# Patient Record
Sex: Male | Born: 2000 | Race: White | Hispanic: No | Marital: Single | Smoking: Never smoker
Health system: Southern US, Community
[De-identification: ages and names within clinical notes are randomized; demographics above are authoritative.]

---
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
## 2018-06-26 DIAGNOSIS — Z23 Encounter for immunization: Secondary | ICD-10-CM | POA: Diagnosis not present

## 2018-07-13 ENCOUNTER — Encounter: Payer: Self-pay | Admitting: Family Medicine

## 2018-07-13 ENCOUNTER — Ambulatory Visit (INDEPENDENT_AMBULATORY_CARE_PROVIDER_SITE_OTHER): Payer: Medicaid Other | Admitting: Family Medicine

## 2018-07-13 VITALS — BP 116/70 | HR 82 | Temp 98.7°F | Wt 146.0 lb

## 2018-07-13 DIAGNOSIS — L03116 Cellulitis of left lower limb: Secondary | ICD-10-CM

## 2018-07-13 MED ORDER — NAPROXEN 500 MG PO TABS: 500.0000 mg | ORAL_TABLET | Freq: Two times a day (BID) | ORAL | 0 refills | Status: DC

## 2018-07-13 MED ORDER — CEPHALEXIN 500 MG PO CAPS: 500.0000 mg | ORAL_CAPSULE | Freq: Four times a day (QID) | ORAL | 0 refills | Status: AC

## 2018-07-13 NOTE — Progress Notes (Signed)
Subjective: CC: Spot on leg PCP: Mechele ClaudeStacks, Warren, MD WJX:BJYNHPI:Purcell Neldon LabellaKallam is a 17 y.o. male presenting to clinic today for:  1. Ingrown hair Patient reports onset of ingrown hair on the left inner thigh about 1 week ago.  He notes on Sunday it started hurting and he attempted to squeeze some fluid out of it.  On Monday the pain increased substantially and he started having pain with his jeans rubbing against it while he walked.  He notes that he applied a heating compress on it last evening and it started draining this morning.  Denies any fevers but does note nausea secondary to significant pain.  He is not used any other medications or topicals on this or taken any medicines for the pain and inflammation.  He did miss work due to lesion today.   ROS: Per HPI  No Known Allergies No past medical history on file.  Current Outpatient Medications:  .  DULoxetine (CYMBALTA) 30 MG capsule, Take 1 capsule (30 mg total) by mouth daily. For one week then two daily. Take with a full stomach at suppertime, Disp: 60 capsule, Rfl: 0 .  loratadine (CLARITIN) 10 MG tablet, Take 10 mg by mouth daily as needed for allergies. OTC, Disp: , Rfl:  .  mometasone (ELOCON) 0.1 % cream, Apply 1 application topically daily., Disp: 45 g, Rfl: 5 Social History   Socioeconomic History  . Marital status: Single    Spouse name: Not on file  . Number of children: Not on file  . Years of education: Not on file  . Highest education level: Not on file  Occupational History  . Not on file  Social Needs  . Financial resource strain: Not on file  . Food insecurity:    Worry: Not on file    Inability: Not on file  . Transportation needs:    Medical: Not on file    Non-medical: Not on file  Tobacco Use  . Smoking status: Never Smoker  . Smokeless tobacco: Never Used  Substance and Sexual Activity  . Alcohol use: No  . Drug use: No  . Sexual activity: Not on file  Lifestyle  . Physical activity:    Days per week:  Not on file    Minutes per session: Not on file  . Stress: Not on file  Relationships  . Social connections:    Talks on phone: Not on file    Gets together: Not on file    Attends religious service: Not on file    Active member of club or organization: Not on file    Attends meetings of clubs or organizations: Not on file    Relationship status: Not on file  . Intimate partner violence:    Fear of current or ex partner: Not on file    Emotionally abused: Not on file    Physically abused: Not on file    Forced sexual activity: Not on file  Other Topics Concern  . Not on file  Social History Narrative  . Not on file   No family history on file.  Objective: Office vital signs reviewed. BP 116/70   Pulse 82   Temp 98.7 F (37.1 C)   Wt 146 lb (66.2 kg)   Physical Examination:  General: Awake, alert, well nourished, No acute distress Skin: Quarter size area of induration and associated erythema with central punctum along the medial aspect of the left thigh.  There is no active drainage.  No active bleeding.  Assessment/ Plan: 17 y.o. male   1. Cellulitis of left lower extremity Likely started out as a folliculitis but now has associated evidence of infection.  He is afebrile demonstrates no evidence of systemic infection.  There is no palpable fluctuance on today's exam and therefore incision and drainage was not attempted.  He does have a central punctum and I have encouraged him to continue warm compresses to the affected area to promote drainage.  Oral antibiotics have been started with Keflex 4 times daily for the next 10 days.  I have also prescribed him oral Naprosyn to use up to twice daily if needed for pain and inflammation.  Reasons for return discussed.  Patient voiced good understanding and will follow-up PRN.   No orders of the defined types were placed in this encounter.  Meds ordered this encounter  Medications  . cephALEXin (KEFLEX) 500 MG capsule    Sig:  Take 1 capsule (500 mg total) by mouth 4 (four) times daily for 10 days.    Dispense:  40 capsule    Refill:  0  . naproxen (NAPROSYN) 500 MG tablet    Sig: Take 1 tablet (500 mg total) by mouth 2 (two) times daily with a meal. (if needed for pain)    Dispense:  30 tablet    Refill:  0      Hulen Skains, DO [facility] [phone]

## 2018-07-13 NOTE — Patient Instructions (Signed)
You have prescribed a nonsteroidal anti-inflammatory drug (NSAID) today. This will help with your pain and inflammation. Please do not take any other NSAIDs (ibuprofen/Motrin/Advil, naproxen/Aleve, meloxicam/Mobic, Voltaren/diclofenac). Please make sure to eat a meal when taking this medication.   Caution:  If you have a history of acid reflux/indigestion, I recommend that you take an antacid (such as Prilosec, Prevacid) daily while on the NSAID.  If you have a history of bleeding disorder, gastric ulcer, are on a blood thinner (like warfarin/Coumadin, Xarelto, Eliquis, etc) please do not take NSAID.  If you have ever had a heart attack, you should not take NSAIDs.   Cellulitis, Adult Cellulitis is a skin infection. The infected area is usually red and tender. This condition occurs most often in the arms and lower legs. The infection can travel to the muscles, blood, and underlying tissue and become serious. It is very important to get treated for this condition. What are the causes? Cellulitis is caused by bacteria. The bacteria enter through a break in the skin, such as a cut, burn, insect bite, open sore, or crack. What increases the risk? This condition is more likely to occur in people who:  Have a weak defense system (immune system).  Have open wounds on the skin such as cuts, burns, bites, and scrapes. Bacteria can enter the body through these open wounds.  Are older.  Have diabetes.  Have a type of long-lasting (chronic) liver disease (cirrhosis) or kidney disease.  Use IV drugs.  What are the signs or symptoms? Symptoms of this condition include:  Redness, streaking, or spotting on the skin.  Swollen area of the skin.  Tenderness or pain when an area of the skin is touched.  Warm skin.  Fever.  Chills.  Blisters.  How is this diagnosed? This condition is diagnosed based on a medical history and physical exam. You may also have tests, including:  Blood  tests.  Lab tests.  Imaging tests.  How is this treated? Treatment for this condition may include:  Medicines, such as antibiotic medicines or antihistamines.  Supportive care, such as rest and application of cold or warm cloths (cold or warm compresses) to the skin.  Hospital care, if the condition is severe.  The infection usually gets better within 1-2 days of treatment. Follow these instructions at home:  Take over-the-counter and prescription medicines only as told by your health care provider.  If you were prescribed an antibiotic medicine, take it as told by your health care provider. Do not stop taking the antibiotic even if you start to feel better.  Drink enough fluid to keep your urine clear or pale yellow.  Do not touch or rub the infected area.  Raise (elevate) the infected area above the level of your heart while you are sitting or lying down.  Apply warm or cold compresses to the affected area as told by your health care provider.  Keep all follow-up visits as told by your health care provider. This is important. These visits let your health care provider make sure a more serious infection is not developing. Contact a health care provider if:  You have a fever.  Your symptoms do not improve within 1-2 days of starting treatment.  Your bone or joint underneath the infected area becomes painful after the skin has healed.  Your infection returns in the same area or another area.  You notice a swollen bump in the infected area.  You develop new symptoms.  You have a  general ill feeling (malaise) with muscle aches and pains. Get help right away if:  Your symptoms get worse.  You feel very sleepy.  You develop vomiting or diarrhea that persists.  You notice red streaks coming from the infected area.  Your red area gets larger or turns dark in color. This information is not intended to replace advice given to you by your health care provider. Make sure  you discuss any questions you have with your health care provider. Document Released: 05/27/2005 Document Revised: 12/26/2015 Document Reviewed: 06/26/2015 Elsevier Interactive Patient Education  [employer]2018 Elsevier Inc.

## 2018-08-15 ENCOUNTER — Ambulatory Visit (INDEPENDENT_AMBULATORY_CARE_PROVIDER_SITE_OTHER): Payer: Medicaid Other | Admitting: Family Medicine

## 2018-08-15 ENCOUNTER — Encounter: Payer: Self-pay | Admitting: Family Medicine

## 2018-08-15 VITALS — BP 127/86 | HR 81 | Temp 98.5°F | Ht 66.0 in | Wt 138.0 lb

## 2018-08-15 DIAGNOSIS — Z113 Encounter for screening for infections with a predominantly sexual mode of transmission: Secondary | ICD-10-CM | POA: Diagnosis not present

## 2018-08-15 DIAGNOSIS — B3789 Other sites of candidiasis: Secondary | ICD-10-CM

## 2018-08-15 DIAGNOSIS — R21 Rash and other nonspecific skin eruption: Secondary | ICD-10-CM | POA: Diagnosis not present

## 2018-08-15 DIAGNOSIS — B356 Tinea cruris: Secondary | ICD-10-CM | POA: Diagnosis not present

## 2018-08-15 MED ORDER — FLUCONAZOLE 150 MG PO TABS: 150.0000 mg | ORAL_TABLET | Freq: Once | ORAL | 0 refills | Status: AC

## 2018-08-15 MED ORDER — CLOTRIMAZOLE-BETAMETHASONE 1-0.05 % EX CREA: 1.0000 "application " | TOPICAL_CREAM | Freq: Two times a day (BID) | CUTANEOUS | 0 refills | Status: DC

## 2018-08-15 NOTE — Progress Notes (Signed)
Subjective:    Patient ID: Kirk Holloway, male    DOB: [birth year], 17 y.o.   MRN: [id number]  Chief Complaint:  Testicular rash   HPI: Kirk Holloway is a 17 y.o. male presenting on 08/15/2018 for Testicular rash  Pt presents today with an ongoing testicular rash. States this started several months ago. States the rash is pruritic and erythematous. States at times the rash drains and is foul smelling. States he does shave his genitals, but this does not seem to increase symptoms. Pt states he is sexually active. Denies penile discharge or open lesions on his penis. Denies fever, chills, thrush, polydipsia, polyphagia, polyuria, dysuria, or weight changes.   Relevant past medical, surgical, family, and social history reviewed and updated as indicated.  Allergies and medications reviewed and updated.   History reviewed. No pertinent past medical history.  History reviewed. No pertinent surgical history.  Social History   Socioeconomic History  . Marital status: Single    Spouse name: Not on file  . Number of children: Not on file  . Years of education: Not on file  . Highest education level: Not on file  Occupational History  . Not on file  Social Needs  . Financial resource strain: Not on file  . Food insecurity:    Worry: Not on file    Inability: Not on file  . Transportation needs:    Medical: Not on file    Non-medical: Not on file  Tobacco Use  . Smoking status: Never Smoker  . Smokeless tobacco: Never Used  Substance and Sexual Activity  . Alcohol use: No  . Drug use: No  . Sexual activity: Not on file  Lifestyle  . Physical activity:    Days per week: Not on file    Minutes per session: Not on file  . Stress: Not on file  Relationships  . Social connections:    Talks on phone: Not on file    Gets together: Not on file    Attends religious service: Not on file    Active member of club or organization: Not on file    Attends meetings of clubs or  organizations: Not on file    Relationship status: Not on file  . Intimate partner violence:    Fear of current or ex partner: Not on file    Emotionally abused: Not on file    Physically abused: Not on file    Forced sexual activity: Not on file  Other Topics Concern  . Not on file  Social History Narrative  . Not on file    Outpatient Encounter Medications as of 08/15/2018  Medication Sig  . clotrimazole-betamethasone (LOTRISONE) cream Apply 1 application topically 2 (two) times Holloway.  . fluconazole (DIFLUCAN) 150 MG tablet Take 1 tablet (150 mg total) by mouth once for 1 dose.  . mometasone (ELOCON) 0.1 % cream Apply 1 application topically Holloway. (Patient not taking: Reported on 07/13/2018)  . [DISCONTINUED] DULoxetine (CYMBALTA) 30 MG capsule Take 1 capsule (30 mg total) by mouth Holloway. For one week then two Holloway. Take with a full stomach at suppertime (Patient not taking: Reported on 07/13/2018)  . [DISCONTINUED] loratadine (CLARITIN) 10 MG tablet Take 10 mg by mouth Holloway as needed for allergies. OTC  . [DISCONTINUED] naproxen (NAPROSYN) 500 MG tablet Take 1 tablet (500 mg total) by mouth 2 (two) times Holloway with a meal. (if needed for pain)   No facility-administered encounter medications on file as of  08/15/2018.     No Known Allergies  Review of Systems  Constitutional: Negative for chills, fatigue, fever and unexpected weight change.  Respiratory: Negative for shortness of breath.   Cardiovascular: Negative for chest pain.  Gastrointestinal: Negative for abdominal pain and rectal pain.  Endocrine: Negative for polydipsia, polyphagia and polyuria.  Genitourinary: Positive for scrotal swelling. Negative for decreased urine volume, discharge, dysuria, enuresis, flank pain, frequency, genital sores, hematuria, penile pain, penile swelling, testicular pain and urgency.       Testicular rash  Musculoskeletal: Negative for arthralgias and myalgias.  Neurological: Negative  for weakness.  Psychiatric/Behavioral: Negative for confusion.  All other systems reviewed and are negative.       Objective:    BP 127/86   Pulse 81   Temp 98.5 F (36.9 C) (Oral)   Ht '5\' 6"'  (1.676 m)   Wt 138 lb (62.6 kg)   BMI 22.27 kg/m    Wt Readings from Last 3 Encounters:  08/15/18 138 lb (62.6 kg) (25 %, Z= -0.68)*  07/13/18 146 lb (66.2 kg) (39 %, Z= -0.28)*  12/30/16 137 lb (62.1 kg) (35 %, Z= -0.39)*   * Growth percentiles are based on CDC (Boys, 2-20 Years) data.    Physical Exam Vitals signs and nursing note reviewed.  Constitutional:      General: He is not in acute distress.    Appearance: Normal appearance. He is not ill-appearing.  HENT:     Head: Normocephalic and atraumatic.     Mouth/Throat:     Mouth: Mucous membranes are moist.     Pharynx: Oropharynx is clear.  Eyes:     Conjunctiva/sclera: Conjunctivae normal.     Pupils: Pupils are equal, round, and reactive to light.  Cardiovascular:     Rate and Rhythm: Normal rate and regular rhythm.     Heart sounds: Normal heart sounds.  Pulmonary:     Effort: Pulmonary effort is normal.     Breath sounds: Normal breath sounds.  Abdominal:     General: Abdomen is flat.     Palpations: Abdomen is soft.     Tenderness: There is no abdominal tenderness.  Genitourinary:    Pubic Area: Rash present. No pubic lice.      Penis: Normal and circumcised. No erythema, tenderness, discharge or lesions.      Scrotum/Testes: Cremasteric reflex is present.        Right: Tenderness (due to rash) present. Mass or swelling not present.        Left: Tenderness (due to rash) present. Mass or swelling not present.     Epididymis:     Right: Normal.     Left: Normal.     Comments: Scrotum: erythematous patches with satellite papules.  Skin:    General: Skin is warm and dry.     Capillary Refill: Capillary refill takes less than 2 seconds.  Neurological:     Mental Status: He is alert and oriented to person,  place, and time.  Psychiatric:        Attention and Perception: Attention and perception normal.        Mood and Affect: Mood normal.        Speech: Speech normal.        Behavior: Behavior normal. Behavior is cooperative.        Thought Content: Thought content normal.        Cognition and Memory: Cognition and memory normal.        Judgment:  Judgment normal.     Results for orders placed or performed in visit on 12/30/16  Rapid strep screen (not at [facility])  Result Value Ref Range   Strep Gp A Ag, IA W/Reflex Positive (A) Negative       Pertinent labs & imaging results that were available during my care of the patient were reviewed by me and considered in my medical decision making.  Assessment & Plan:  Diago was seen today for testicular rash.  Diagnoses and all orders for this visit:  Scrotal rash -     Chlamydia/Gonococcus/Trichomonas, NAA -     HIV Antibody (routine testing w rflx) -     BMP8+EGFR -     fluconazole (DIFLUCAN) 150 MG tablet; Take 1 tablet (150 mg total) by mouth once for 1 dose. -     clotrimazole-betamethasone (LOTRISONE) cream; Apply 1 application topically 2 (two) times Holloway.  Candida rash of groin Due to ongoing rash, will screen for diabetes and HIV today. Keep area as dry as possible. Wear loose fitting cotton underwear. Shower instead of baths. Proper hygiene discussed. Medications as prescribed. Report any new or worsening symptoms.  -     BMP8+EGFR -     fluconazole (DIFLUCAN) 150 MG tablet; Take 1 tablet (150 mg total) by mouth once for 1 dose. -     clotrimazole-betamethasone (LOTRISONE) cream; Apply 1 application topically 2 (two) times Holloway.  Routine screening for STI (sexually transmitted infection) -     Chlamydia/Gonococcus/Trichomonas, NAA -     HIV Antibody (routine testing w rflx)    Continue all other maintenance medications.  Follow up plan: Return in about 4 weeks (around 09/12/2018), or if symptoms worsen or fail to  improve.  Educational handout given for jock itch  The above assessment and management plan was discussed with the patient. The patient verbalized understanding of and has agreed to the management plan. Patient is aware to call the clinic if symptoms persist or worsen. Patient is aware when to return to the clinic for a follow-up visit. Patient educated on when it is appropriate to go to the emergency department.   Monia Pouch, FNP-C [facility] [phone]

## 2018-08-15 NOTE — Patient Instructions (Signed)
Jock Itch Jock itch (tinea cruris) is a fungal infection of the skin in the groin area. It is sometimes called ringworm, even though it is not caused by worms. It is caused by a fungus, which is a type of germ that thrives in dark, damp places. Jock itch causes a rash and itching in the groin and upper thigh area. It usually goes away in 2-3 weeks with treatment. What are the causes? The fungus that causes jock itch may be spread by:  Touching a fungus infection elsewhere on your body-such as athlete's foot-and then touching your groin area.  Sharing towels or clothing with an infected person.  What increases the risk? Jock itch is most common in men and adolescent boys. This condition is more likely to develop from:  Being in hot, humid climates.  Wearing tight-fitting clothing or wet bathing suits for long periods of time.  Participating in sports.  Being overweight.  Having diabetes.  What are the signs or symptoms? Symptoms of jock itch may include:  A red, pink, or brown rash in the groin area. The rash may spread to the thighs, anus, and buttocks.  Dry and scaly skin on or around the rash.  Itchiness.  How is this diagnosed? Most often, a health care provider can make the diagnosis by looking at your rash. Sometimes, a scraping of the infected skin will be taken. This sample may be tested by looking at it under a microscope or by trying to grow the fungus from the sample (culture). How is this treated? Treatment for this condition may include:  Antifungal medicine to kill the fungus. This may be in various forms: ? Skin cream or ointment. ? Medicine taken by mouth.  Skin cream or ointment to reduce the itching.  Compresses or medicated powders to dry the infected skin.  Follow these instructions at home:  Take medicines only as directed by your health care provider. Apply skin creams or ointments exactly as directed.  Wear loose-fitting clothing. ? Men should  wear cotton boxer shorts. ? Women should wear cotton underwear.  Change your underwear every day to keep your groin dry.  Avoid hot baths.  Dry your groin area well after bathing. ? Use a separate towel to dry your groin area. This will help to prevent a spreading of the infection to other areas of your body.  Do not scratch the affected area.  Do not share towels with other people. Contact a health care provider if:  Your rash does not improve or it gets worse after 2 weeks of treatment.  Your rash is spreading.  Your rash returns after treatment is finished.  You have a fever.  You have redness, swelling, or pain in the area around your rash.  You have fluid, blood, or pus coming from your rash.  Your have your rash for more than 4 weeks. This information is not intended to replace advice given to you by your health care provider. Make sure you discuss any questions you have with your health care provider. Document Released: 08/07/2002 Document Revised: 01/23/2016 Document Reviewed: 05/29/2014 Elsevier Interactive Patient Education  2018 Elsevier Inc.  

## 2018-08-16 LAB — HIV ANTIBODY (ROUTINE TESTING W REFLEX): HIV SCREEN 4TH GENERATION: NONREACTIVE

## 2018-08-16 LAB — BMP8+EGFR
BUN / CREAT RATIO: 14 (ref 9–20)
BUN: 14 mg/dL (ref 6–20)
CHLORIDE: 102 mmol/L (ref 96–106)
CO2: 22 mmol/L (ref 20–29)
Calcium: 9.7 mg/dL (ref 8.7–10.2)
Creatinine, Ser: 1.03 mg/dL (ref 0.76–1.27)
GFR calc non Af Amer: 105 mL/min/{1.73_m2} (ref >59)
GFR, EST AFRICAN AMERICAN: 121 mL/min/{1.73_m2} (ref >59)
Glucose: 85 mg/dL (ref 65–99)
POTASSIUM: 3.9 mmol/L (ref 3.5–5.2)
SODIUM: 142 mmol/L (ref 134–144)

## 2018-08-17 LAB — CHLAMYDIA/GONOCOCCUS/TRICHOMONAS, NAA
CHLAMYDIA BY NAA: NEGATIVE
GONOCOCCUS BY NAA: NEGATIVE
TRICH VAG BY NAA: NEGATIVE

## 2018-08-29 ENCOUNTER — Encounter: Payer: Self-pay | Admitting: Family Medicine

## 2018-08-29 ENCOUNTER — Ambulatory Visit (INDEPENDENT_AMBULATORY_CARE_PROVIDER_SITE_OTHER): Payer: Medicaid Other | Admitting: Family Medicine

## 2018-08-29 VITALS — BP 137/81 | HR 101 | Temp 98.0°F | Ht 66.0 in | Wt 144.8 lb

## 2018-08-29 DIAGNOSIS — L738 Other specified follicular disorders: Secondary | ICD-10-CM | POA: Diagnosis not present

## 2018-08-29 MED ORDER — MINOCYCLINE HCL 100 MG PO CAPS: 100.0000 mg | ORAL_CAPSULE | Freq: Two times a day (BID) | ORAL | 0 refills | Status: DC

## 2018-08-29 MED ORDER — FLUCONAZOLE 100 MG PO TABS: ORAL_TABLET | ORAL | 0 refills | Status: DC

## 2018-08-29 MED ORDER — ONDANSETRON 8 MG PO TBDP: 8.0000 mg | ORAL_TABLET | Freq: Four times a day (QID) | ORAL | 1 refills | Status: DC | PRN

## 2018-08-29 NOTE — Progress Notes (Signed)
Chief Complaint  Patient presents with  . itching rash on testicles    HPI  Patient presents today for itching rash at the testicles partially treated last week with Lotrisone and Diflucan.  He is now having some red papules in the upper inner thighs pubic area and one on the hip.  1 of the areas opened up and drained scant amount of mattering earlier today.  PMH: Smoking status noted ROS: Per HPI  Objective: BP 137/81   Pulse (!) 101   Temp 98 F (36.7 C)   Ht 5\' 6"  (1.676 m)   Wt 144 lb 12.8 oz (65.7 kg)   BMI 23.37 kg/m  Gen: NAD, alert, cooperative with exam HEENT: NCAT, EOMI, PERRL CV: RRR, good S1/S2, no murmur Resp: CTABL, no wheezes, non-labored Skin: There are scant erythematous papules.  These are scattered on the upper inner thighs.  There is one at the right inner thigh that is swollen and red measuring 1 cm it opened and has some pus draining from it. Ext: No edema, warm Neuro: Alert and oriented, No gross deficits  Assessment and plan:  1. Bacterial folliculitis     Meds ordered this encounter  Medications  . minocycline (MINOCIN) 100 MG capsule    Sig: Take 1 capsule (100 mg total) by mouth 2 (two) times daily. Take on an empty stomach    Dispense:  20 capsule    Refill:  0  . fluconazole (DIFLUCAN) 100 MG tablet    Sig: Take two with first dose. Then starting the next day take one daily until all are taken.    Dispense:  11 tablet    Refill:  0  . ondansetron (ZOFRAN-ODT) 8 MG disintegrating tablet    Sig: Take 1 tablet (8 mg total) by mouth every 6 (six) hours as needed for nausea or vomiting.    Dispense:  20 tablet    Refill:  1      Follow up as needed.  Mechele ClaudeWarren Astaria Nanez, MD

## 2018-11-07 DIAGNOSIS — L309 Dermatitis, unspecified: Secondary | ICD-10-CM | POA: Diagnosis not present

## 2018-11-07 DIAGNOSIS — L739 Follicular disorder, unspecified: Secondary | ICD-10-CM | POA: Diagnosis not present

## 2019-01-04 DIAGNOSIS — L089 Local infection of the skin and subcutaneous tissue, unspecified: Secondary | ICD-10-CM | POA: Diagnosis not present

## 2019-01-04 DIAGNOSIS — L080 Pyoderma: Secondary | ICD-10-CM | POA: Diagnosis not present

## 2019-07-14 ENCOUNTER — Other Ambulatory Visit: Payer: Self-pay

## 2019-07-14 DIAGNOSIS — Z20822 Contact with and (suspected) exposure to covid-19: Secondary | ICD-10-CM

## 2019-07-16 LAB — NOVEL CORONAVIRUS, NAA: SARS-CoV-2, NAA: NOT DETECTED

## 2019-08-14 ENCOUNTER — Other Ambulatory Visit: Payer: Self-pay

## 2019-08-15 ENCOUNTER — Encounter: Payer: Self-pay | Admitting: Family Medicine

## 2019-08-15 ENCOUNTER — Ambulatory Visit (INDEPENDENT_AMBULATORY_CARE_PROVIDER_SITE_OTHER): Payer: BC Managed Care – PPO | Admitting: Family Medicine

## 2019-08-15 VITALS — BP 117/70 | HR 76 | Temp 97.8°F | Resp 20 | Ht 66.0 in | Wt 137.0 lb

## 2019-08-15 DIAGNOSIS — E010 Iodine-deficiency related diffuse (endemic) goiter: Secondary | ICD-10-CM

## 2019-08-15 NOTE — Progress Notes (Signed)
Subjective:  Patient ID: Kirk Holloway, male    DOB: 02/26/2001  Age: 18 y.o. MRN: [id number]  CC: Medical Management of Chronic Issues (thyroid check )   HPI Euel Castile presents for concern for thyromegaly. HE wasnts to enter the [employer], but was told he had an enlarged thyroid a few years ago. This was never confirmed. Pt. Denies sx of hhyperand hypothyroidism except that he has lost weight, 12 lbs. But attributes that to diet.   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   He has no past surgical history on file.   His family history is not on file.He reports that he has never smoked. He has never used smokeless tobacco. He reports that he does not drink alcohol or use drugs.    ROS Review of Systems  Constitutional: Positive for appetite change. Negative for activity change and fever.  HENT: Negative.   Respiratory: Negative for cough, chest tightness and shortness of breath.   Cardiovascular: Negative for chest pain and palpitations.  Gastrointestinal: Negative for abdominal pain.  Musculoskeletal: Negative for arthralgias.    Objective:  BP 117/70   Pulse 76   Temp 97.8 F (36.6 C)   Resp 20   Ht 5\' 6"  (1.676 m)   Wt 137 lb (62.1 kg)   SpO2 99%   BMI 22.11 kg/m   BP Readings from Last 3 Encounters:  08/15/19 117/70  08/29/18 137/81  08/15/18 127/86    Wt Readings from Last 3 Encounters:  08/15/19 137 lb (62.1 kg)  08/29/18 144 lb 12.8 oz (65.7 kg) (36 %, Z= -0.36)*  08/15/18 138 lb (62.6 kg) (25 %, Z= -0.68)*   * Growth percentiles  are based on CDC (Boys, 2-20 Years) data.     Physical Exam Vitals reviewed.  Constitutional:      Appearance: He is well-developed.  HENT:     Head: Normocephalic and atraumatic.     Right Ear: Tympanic membrane and external ear normal. No decreased hearing noted.     Left Ear: Tympanic membrane and external ear normal. No decreased hearing noted.     Mouth/Throat:     Pharynx: No oropharyngeal exudate or posterior oropharyngeal erythema.  Eyes:     Pupils: Pupils are equal, round, and reactive to light.  Cardiovascular:     Rate and Rhythm: Normal rate and regular rhythm.     Heart sounds: No murmur.  Pulmonary:     Effort: No respiratory distress.     Breath sounds: Normal breath sounds.  Abdominal:     General: Bowel sounds are normal.     Palpations: Abdomen is soft. There is no mass.     Tenderness: There is no abdominal tenderness.  Musculoskeletal:     Cervical back: Normal range of motion and neck supple.       Assessment & Plan:   Bennett was seen today for medical management of chronic issues.  Diagnoses and all orders for  this visit:  Thyromegaly -     CBC -     CMP -     TSH + free T4 -     US Thyroid & ST Neck; Future       I have discontinued [facility]'s clotrimazole-betamethasone, minocycline, fluconazole, and ondansetron.  Allergies as of 08/15/2019   No Known Allergies     Medication List       Accurate as of August 15, 2019  3:45 PM. If you have any questions, ask your nurse or doctor.        STOP taking these medications   clotrimazole-betamethasone cream Commonly known as: Lotrisone Stopped by: Mechele Claude, MD   fluconazole 100 MG tablet Commonly known as: Diflucan Stopped by: Mechele Claude, MD   minocycline 100 MG capsule Commonly known as: Minocin Stopped by: Mechele Claude, MD   ondansetron 8 MG disintegrating tablet Commonly known as: ZOFRAN-ODT Stopped by: Mechele Claude, MD        Follow-up: Return if  symptoms worsen or fail to improve.  Mechele Claude, M.D.

## 2019-08-21 ENCOUNTER — Encounter: Payer: Self-pay | Admitting: *Deleted

## 2019-08-23 ENCOUNTER — Other Ambulatory Visit: Payer: Self-pay | Admitting: Family Medicine

## 2019-08-23 ENCOUNTER — Other Ambulatory Visit: Payer: Self-pay

## 2019-08-23 ENCOUNTER — Ambulatory Visit (HOSPITAL_COMMUNITY)
Admission: RE | Admit: 2019-08-23 | Discharge: 2019-08-23 | Disposition: A | Discharge status: Home / Self Care | Payer: BC Managed Care – PPO | Source: Ambulatory Visit | Attending: Family Medicine | Admitting: Family Medicine

## 2019-08-23 DIAGNOSIS — E010 Iodine-deficiency related diffuse (endemic) goiter: Secondary | ICD-10-CM | POA: Insufficient documentation

## 2020-06-25 DIAGNOSIS — S0591XA Unspecified injury of right eye and orbit, initial encounter: Secondary | ICD-10-CM | POA: Diagnosis not present

## 2020-06-26 DIAGNOSIS — S0511XA Contusion of eyeball and orbital tissues, right eye, initial encounter: Secondary | ICD-10-CM | POA: Diagnosis not present

## 2020-06-26 DIAGNOSIS — H0288A Meibomian gland dysfunction right eye, upper and lower eyelids: Secondary | ICD-10-CM | POA: Diagnosis not present

## 2020-06-26 DIAGNOSIS — H1131 Conjunctival hemorrhage, right eye: Secondary | ICD-10-CM | POA: Diagnosis not present

## 2020-06-26 DIAGNOSIS — H0288B Meibomian gland dysfunction left eye, upper and lower eyelids: Secondary | ICD-10-CM | POA: Diagnosis not present

## 2020-10-13 DIAGNOSIS — H5213 Myopia, bilateral: Secondary | ICD-10-CM | POA: Diagnosis not present

## 2021-01-06 ENCOUNTER — Encounter: Payer: Self-pay | Admitting: Nurse Practitioner

## 2021-01-06 ENCOUNTER — Ambulatory Visit (INDEPENDENT_AMBULATORY_CARE_PROVIDER_SITE_OTHER): Payer: Medicaid Other | Admitting: Nurse Practitioner

## 2021-01-06 DIAGNOSIS — J029 Acute pharyngitis, unspecified: Secondary | ICD-10-CM | POA: Diagnosis not present

## 2021-01-06 LAB — RAPID STREP SCREEN (MED CTR MEBANE ONLY): Strep Gp A Ag, IA W/Reflex: NEGATIVE

## 2021-01-06 LAB — CULTURE, GROUP A STREP

## 2021-01-06 MED ORDER — FLUTICASONE PROPIONATE 50 MCG/ACT NA SUSP: 2.0000 | Freq: Every day | NASAL | 6 refills | Status: DC

## 2021-01-06 MED ORDER — AMOXICILLIN-POT CLAVULANATE 875-125 MG PO TABS: 1.0000 | ORAL_TABLET | Freq: Two times a day (BID) | ORAL | 0 refills | Status: DC

## 2021-01-06 NOTE — Progress Notes (Signed)
   Virtual Visit  Note Due to COVID-19 pandemic this visit was conducted virtually. This visit type was conducted due to national recommendations for restrictions regarding the COVID-19 Pandemic (e.g. social distancing, sheltering in place) in an effort to limit this patient's exposure and mitigate transmission in our community. All issues noted in this document were discussed and addressed.  A physical exam was not performed with this format.  I connected with Kirk Holloway on 01/06/21 at 11:19 PM by telephone and verified that I am speaking with the correct person using two identifiers. Kirk Holloway is currently located at home during visit. The provider, Daryll Drown, NP is located in their office at time of visit.  I discussed the limitations, risks, security and privacy concerns of performing an evaluation and management service by telephone and the availability of in person appointments. I also discussed with the patient that there may be a patient responsible charge related to this service. The patient expressed understanding and agreed to proceed.   History and Present Illness:  Sore Throat  This is a new problem. The problem has been gradually worsening. Neither side of throat is experiencing more pain than the other. There has been no fever. The pain is moderate. Associated symptoms include congestion, ear pain and swollen glands. Associated symptoms comments: Jaw pain. He has had no exposure to strep or mono.      Review of Systems  Constitutional: Negative for chills, fever and malaise/fatigue.  HENT: Positive for congestion, ear pain and sore throat.   Skin: Negative for rash.  All other systems reviewed and are negative.    Observations/Objective:  Televisit-patient did not sound to be in distress.  Assessment and Plan:  Sore throat In terms not well controlled. Take medication as prescribed Augmentin 875-125 mg tablet twice daily. Flonase COVID-19 swab/strep throat  completed results pending. Education provided to patient  Rx sent to pharmacy Follow-up with worsening or unresolved symptoms.   Follow Up Instructions: Follow-up with worsening or unresolved symptoms.    I discussed the assessment and treatment plan with the patient. The patient was provided an opportunity to ask questions and all were answered. The patient agreed with the plan and demonstrated an understanding of the instructions.   The patient was advised to call back or seek an in-person evaluation if the symptoms worsen or if the condition fails to improve as anticipated.  The above assessment and management plan was discussed with the patient. The patient verbalized understanding of and has agreed to the management plan. Patient is aware to call the clinic if symptoms persist or worsen. Patient is aware when to return to the clinic for a follow-up visit. Patient educated on when it is appropriate to go to the emergency department.   Time call ended: 11:25 PM  I provided 6 minutes of  non face-to-face time during this encounter.    Daryll Drown, NP

## 2021-01-06 NOTE — Patient Instructions (Signed)

## 2021-01-06 NOTE — Assessment & Plan Note (Signed)
In terms not well controlled. Take medication as prescribed Augmentin 875-125 mg tablet twice daily. Flonase COVID-19 swab/strep throat completed results pending. Education provided to patient  Rx sent to pharmacy Follow-up with worsening or unresolved symptoms.

## 2021-01-07 LAB — NOVEL CORONAVIRUS, NAA: SARS-CoV-2, NAA: NOT DETECTED

## 2021-01-07 LAB — SARS-COV-2, NAA 2 DAY TAT

## 2021-01-28 ENCOUNTER — Telehealth: Payer: Self-pay

## 2021-01-28 NOTE — Telephone Encounter (Signed)
I contacted the patients father and he already has his son scheduled to see a different doctor tomorrow and he said thank you for accepting him but he will call back later to schedule.

## 2021-01-28 NOTE — Telephone Encounter (Signed)
Patient father is calling and stated that provider is his pcp and wanted to know if provider would accept son as new patient, please advise. CB is [phone]

## 2021-01-28 NOTE — Telephone Encounter (Signed)
I contacted the patients father and he will call back later to schedule

## 2021-01-29 ENCOUNTER — Other Ambulatory Visit: Payer: Self-pay

## 2021-01-29 ENCOUNTER — Encounter: Payer: Self-pay | Admitting: Nurse Practitioner

## 2021-01-29 ENCOUNTER — Ambulatory Visit (INDEPENDENT_AMBULATORY_CARE_PROVIDER_SITE_OTHER): Payer: Medicaid Other | Admitting: Nurse Practitioner

## 2021-01-29 VITALS — BP 122/77 | HR 110 | Temp 97.9°F | Ht 66.0 in | Wt 152.0 lb

## 2021-01-29 DIAGNOSIS — H6121 Impacted cerumen, right ear: Secondary | ICD-10-CM | POA: Diagnosis not present

## 2021-01-29 MED ORDER — DEBROX 6.5 % OT SOLN: 5.0000 [drp] | Freq: Two times a day (BID) | OTIC | 0 refills | Status: DC

## 2021-01-29 NOTE — Patient Instructions (Signed)

## 2021-01-29 NOTE — Assessment & Plan Note (Signed)
Ceruminosis is noted.  Wax is removed by syringing and manual debridement. Instructions for home care to prevent wax buildup are given, patient verbalized understanding.

## 2021-01-29 NOTE — Progress Notes (Signed)
Acute Office Visit  Subjective:    Patient ID: Kirk Holloway, male    DOB: 12/21/2000, 20 y.o.   MRN: [id number]  Chief Complaint  Patient presents with  . Hearing Problem    HPI Patient is a 20 year old male who presents to clinic with decrease hearing and cerumen impaction in the last few weeks.  No fever, pain, dizziness, or ringing.  Patient reports in the past has had a history of cerumen impaction.   Past Medical History:  Diagnosis Date  . Familial short stature 04/30/2016    History reviewed. No pertinent surgical history.  History reviewed. No pertinent family history.  Social History   Socioeconomic History  . Marital status: Single    Spouse name: Not on file  . Number of children: Not on file  . Years of education: Not on file  . Highest education level: Not on file  Occupational History  . Not on file  Tobacco Use  . Smoking status: Never Smoker  . Smokeless tobacco: Never Used  Vaping Use  . Vaping Use: Never used  Substance and Sexual Activity  . Alcohol use: No  . Drug use: No  . Sexual activity: Not on file  Other Topics Concern  . Not on file  Social History Narrative  . Not on file   Social Determinants of Health   Financial Resource Strain: Not on file  Food Insecurity: Not on file  Transportation Needs: Not on file  Physical Activity: Not on file  Stress: Not on file  Social Connections: Not on file  Intimate Partner Violence: Not on file    Outpatient Medications Prior to Visit  Medication Sig Dispense Refill  . amoxicillin-clavulanate (AUGMENTIN) 875-125 MG tablet Take 1 tablet by mouth 2 (two) times daily. 14 tablet 0  . fluticasone (FLONASE) 50 MCG/ACT nasal spray Place 2 sprays into both nostrils daily. 16 g 6   No facility-administered medications prior to visit.    No Known Allergies  Review of Systems  Constitutional: Negative.   HENT: Positive for hearing loss. Negative for ear pain, sinus pressure, sinus pain and  tinnitus.   Eyes: Negative.   Respiratory: Negative.   All other systems reviewed and are negative.      Objective:    Physical Exam Vitals and nursing note reviewed.  Constitutional:      Appearance: Normal appearance.  HENT:     Head: Normocephalic and atraumatic.     Right Ear: No tenderness. There is impacted cerumen. No foreign body.     Left Ear: Hearing normal. No tenderness.     Nose: Nose normal.  Eyes:     Conjunctiva/sclera: Conjunctivae normal.     Pupils: Pupils are equal, round, and reactive to light.  Cardiovascular:     Pulses: Normal pulses.     Heart sounds: Normal heart sounds.  Pulmonary:     Effort: Pulmonary effort is normal.     Breath sounds: Normal breath sounds.  Abdominal:     General: Bowel sounds are normal.  Skin:    Findings: No rash.  Neurological:     Mental Status: He is alert.     BP 122/77   Pulse (!) 110   Temp 97.9 F (36.6 C) (Temporal)   Ht 5\' 6"  (1.676 m)   Wt 152 lb (68.9 kg)   SpO2 99%   BMI 24.53 kg/m  Wt Readings from Last 3 Encounters:  01/29/21 152 lb (68.9 kg)  08/15/19 137 lb (62.1  kg)  08/29/18 144 lb 12.8 oz (65.7 kg) (36 %, Z= -0.36)*   * Growth percentiles are based on CDC (Boys, 2-20 Years) data.    Health Maintenance Due  Topic Date Due  . HPV VACCINES (1 - Male 2-dose series) Never done  . Hepatitis C Screening  Never done  . TETANUS/TDAP  08/15/2020       Topic Date Due  . HPV VACCINES (1 - Male 2-dose series) Never done     Lab Results  Component Value Date   TSH 1.57 04/30/2016   Lab Results  Component Value Date   WBC 6.4 04/30/2016   HGB 14.2 04/30/2016   HCT 41.6 04/30/2016   MCV 89.3 04/30/2016   PLT 270 04/30/2016   Lab Results  Component Value Date   NA 142 08/15/2018   K 3.9 08/15/2018   CO2 22 08/15/2018   GLUCOSE 85 08/15/2018   BUN 14 08/15/2018   CREATININE 1.03 08/15/2018   CALCIUM 9.7 08/15/2018   No results found for: CHOL No results found for: HDL No  results found for: LDLCALC No results found for: TRIG No results found for: CHOLHDL No results found for: LDJT7S     Assessment & Plan:   Problem List Items Addressed This Visit      Nervous and Auditory   Cerumen debris on tympanic membrane of right ear - Primary    Ceruminosis is noted.  Wax is removed by syringing and manual debridement. Instructions for home care to prevent wax buildup are given, patient verbalized understanding.      Relevant Medications   carbamide peroxide (DEBROX) 6.5 % OTIC solution       Meds ordered this encounter  Medications  . carbamide peroxide (DEBROX) 6.5 % OTIC solution    Sig: Place 5 drops into both ears 2 (two) times daily.    Dispense:  15 mL    Refill:  0    Order Specific Question:   Supervising Provider    Answer:   Raliegh Ip [[id number]]     Daryll Drown, NP

## 2021-03-27 ENCOUNTER — Encounter: Payer: Self-pay | Admitting: Family Medicine

## 2021-03-27 ENCOUNTER — Other Ambulatory Visit: Payer: Self-pay

## 2021-03-27 ENCOUNTER — Ambulatory Visit: Payer: Medicaid Other | Admitting: Family Medicine

## 2021-03-27 VITALS — BP 120/78 | HR 86 | Temp 97.4°F | Ht 66.0 in | Wt 147.8 lb

## 2021-03-27 DIAGNOSIS — N50811 Right testicular pain: Secondary | ICD-10-CM

## 2021-03-27 DIAGNOSIS — L293 Anogenital pruritus, unspecified: Secondary | ICD-10-CM | POA: Diagnosis not present

## 2021-03-27 LAB — URINALYSIS, ROUTINE W REFLEX MICROSCOPIC
Bilirubin, UA: NEGATIVE
Glucose, UA: NEGATIVE
Leukocytes,UA: NEGATIVE
Nitrite, UA: NEGATIVE
Protein,UA: NEGATIVE
Specific Gravity, UA: 1.025 (ref 1.005–1.030)
Urobilinogen, Ur: 1 mg/dL (ref 0.2–1.0)
pH, UA: 6.5 (ref 5.0–7.5)

## 2021-03-27 LAB — MICROSCOPIC EXAMINATION
Bacteria, UA: NONE SEEN
WBC, UA: NONE SEEN /hpf (ref 0–5)

## 2021-03-27 NOTE — Progress Notes (Signed)
Acute Office Visit  Subjective:    Patient ID: Kirk Holloway, male    DOB: 05-25-2001, 20 y.o.   MRN: [id number]  Chief Complaint  Patient presents with   Testicle Pain    HPI Patient is in today for right testicular pain x 7 months. The pain is in his right testicle that is intermittent. It feels like a throb. It occurs with ejaculation or with lack of ejaculation following arousal. The pain is a 4/10, enough to be annoying. It sometimes last for a few minutes, sometimes for hours. Staying still makes the pain better. Denies swelling, lumps, erythema, or rash. Denies discharge. He has been sexually active with a total of 2 male partners in the last 7 months. He has only been with one partner for the last 3-4 months. He does not use condoms.   He reports itching around his penis tip for 5 days. He reports doing LSD on Friday night. He went swimming in a river Saturday morning and has had the itching since then. The itching comes and goes, has been unchanged. Denies rash or discharge. The itching improves with voiding. Denies fever, flank, nausea, vomiting, abdominal pain, urgency, frequency, or dysuria. He increased his fluid intake last night and this helped.   Past Medical History:  Diagnosis Date   Familial short stature 04/30/2016    History reviewed. No pertinent surgical history.  History reviewed. No pertinent family history.  Social History   Socioeconomic History   Marital status: Single    Spouse name: Not on file   Number of children: Not on file   Years of education: Not on file   Highest education level: Not on file  Occupational History   Not on file  Tobacco Use   Smoking status: Never   Smokeless tobacco: Never  Vaping Use   Vaping Use: Never used  Substance and Sexual Activity   Alcohol use: No   Drug use: No   Sexual activity: Yes  Other Topics Concern   Not on file  Social History Narrative   Not on file   Social Determinants of Health    Financial Resource Strain: Not on file  Food Insecurity: Not on file  Transportation Needs: Not on file  Physical Activity: Not on file  Stress: Not on file  Social Connections: Not on file  Intimate Partner Violence: Not on file    Outpatient Medications Prior to Visit  Medication Sig Dispense Refill   carbamide peroxide (DEBROX) 6.5 % OTIC solution Place 5 drops into both ears 2 (two) times daily. 15 mL 0   No facility-administered medications prior to visit.    No Known Allergies  Review of Systems Negative unless specially indicated above in HPI.     Objective:    Physical Exam Vitals and nursing note reviewed. Exam conducted with a chaperone present.  Constitutional:      General: He is not in acute distress.    Appearance: Normal appearance. He is not ill-appearing, toxic-appearing or diaphoretic.  Pulmonary:     Effort: Pulmonary effort is normal. No respiratory distress.     Breath sounds: Normal breath sounds.  Abdominal:     Hernia: There is no hernia in the left inguinal area or right inguinal area.  Genitourinary:    Pubic Area: No rash or pubic lice.      Penis: Normal and circumcised. No hypospadias, erythema, tenderness, discharge, swelling or lesions.      Testes: Normal. Cremasteric reflex is present.  Right: Mass, tenderness, swelling, testicular hydrocele or varicocele not present. Right testis is descended. Cremasteric reflex is present.         Left: Mass, tenderness, swelling, testicular hydrocele or varicocele not present. Left testis is descended. Cremasteric reflex is present.      Epididymis:     Right: Normal.     Left: Normal.  Lymphadenopathy:     Lower Body: No right inguinal adenopathy. No left inguinal adenopathy.  Skin:    General: Skin is warm and dry.  Neurological:     Mental Status: He is alert.    BP 120/78   Pulse 86   Temp (!) 97.4 F (36.3 C) (Temporal)   Ht 5\' 6"  (1.676 m)   Wt 147 lb 12.8 oz (67 kg)   SpO2  98%   BMI 23.86 kg/m  Wt Readings from Last 3 Encounters:  03/27/21 147 lb 12.8 oz (67 kg)  01/29/21 152 lb (68.9 kg)  08/15/19 137 lb (62.1 kg)   Urine dipstick shows positive for RBC's and positive for ketones.  Micro exam: 0 WBC's per HPF and 0-2 RBC's per HPF.   Health Maintenance Due  Topic Date Due   HPV VACCINES (1 - Male 2-dose series) Never done   Hepatitis C Screening  Never done   TETANUS/TDAP  08/15/2020       Topic Date Due   HPV VACCINES (1 - Male 2-dose series) Never done     Lab Results  Component Value Date   TSH 1.57 04/30/2016   Lab Results  Component Value Date   WBC 6.4 04/30/2016   HGB 14.2 04/30/2016   HCT 41.6 04/30/2016   MCV 89.3 04/30/2016   PLT 270 04/30/2016   Lab Results  Component Value Date   NA 142 08/15/2018   K 3.9 08/15/2018   CO2 22 08/15/2018   GLUCOSE 85 08/15/2018   BUN 14 08/15/2018   CREATININE 1.03 08/15/2018   CALCIUM 9.7 08/15/2018   No results found for: CHOL No results found for: HDL No results found for: LDLCALC No results found for: TRIG No results found for: CHOLHDL No results found for: 08/17/2018     Assessment & Plan:   Tayson was seen today for testicle pain.  Diagnoses and all orders for this visit:  Itching of penis Normal exam. Negative UA. Urine cytology for STD screening is pending. Discussed hydration.  -     Urinalysis, Routine w reflex microscopic -     Urine Culture -     Urine cytology ancillary only; Future  Right testicular pain Normal exam. Negative UA. Urine cytology pending. Discussed ordering [place] Pert if negative cytology.  -     Urinalysis, Routine w reflex microscopic -     Urine Culture -     Urine cytology ancillary only; Future  Return to office for new or worsening symptoms, or if symptoms persist.   The patient indicates understanding of these issues and agrees with the plan.  [place], FNP

## 2021-03-29 LAB — URINE CULTURE: Organism ID, Bacteria: NO GROWTH

## 2021-03-31 ENCOUNTER — Other Ambulatory Visit: Payer: Self-pay

## 2021-03-31 ENCOUNTER — Other Ambulatory Visit: Payer: Self-pay | Admitting: Family Medicine

## 2021-03-31 ENCOUNTER — Other Ambulatory Visit (HOSPITAL_COMMUNITY)
Admission: RE | Admit: 2021-03-31 | Discharge: 2021-03-31 | Disposition: A | Discharge status: Home / Self Care | Payer: Medicaid Other | Source: Ambulatory Visit | Attending: Family Medicine | Admitting: Family Medicine

## 2021-03-31 ENCOUNTER — Other Ambulatory Visit: Payer: Medicaid Other

## 2021-03-31 DIAGNOSIS — Z113 Encounter for screening for infections with a predominantly sexual mode of transmission: Secondary | ICD-10-CM

## 2021-03-31 DIAGNOSIS — L293 Anogenital pruritus, unspecified: Secondary | ICD-10-CM | POA: Diagnosis not present

## 2021-03-31 DIAGNOSIS — N50811 Right testicular pain: Secondary | ICD-10-CM | POA: Insufficient documentation

## 2021-04-02 ENCOUNTER — Other Ambulatory Visit: Payer: Self-pay | Admitting: Family Medicine

## 2021-04-02 DIAGNOSIS — N50811 Right testicular pain: Secondary | ICD-10-CM

## 2021-04-02 LAB — URINE CYTOLOGY ANCILLARY ONLY
Chlamydia: NEGATIVE
Comment: NEGATIVE
Comment: NORMAL
Neisseria Gonorrhea: NEGATIVE

## 2021-04-08 ENCOUNTER — Ambulatory Visit: Payer: Medicaid Other | Admitting: Family Medicine

## 2021-04-08 ENCOUNTER — Encounter: Payer: Self-pay | Admitting: Family Medicine

## 2021-04-08 ENCOUNTER — Other Ambulatory Visit: Payer: Self-pay

## 2021-04-08 VITALS — BP 108/64 | HR 83 | Temp 97.8°F | Ht 66.0 in | Wt 149.8 lb

## 2021-04-08 DIAGNOSIS — F1590 Other stimulant use, unspecified, uncomplicated: Secondary | ICD-10-CM | POA: Diagnosis not present

## 2021-04-08 DIAGNOSIS — F988 Other specified behavioral and emotional disorders with onset usually occurring in childhood and adolescence: Secondary | ICD-10-CM

## 2021-04-08 MED ORDER — LISDEXAMFETAMINE DIMESYLATE 20 MG PO CAPS: 20.0000 mg | ORAL_CAPSULE | ORAL | 0 refills | Status: DC

## 2021-04-08 NOTE — Progress Notes (Signed)
Subjective:  Patient ID: Kirk Holloway, male    DOB: 29-Mar-2001  Age: 20 y.o. MRN: [id number]  CC: Trouble concentrating   HPI Zayvier Caravello presents for ADD he is having trouble focusing at school.  He is very easily distracted.  He is having trouble getting started on projects and then not finishing them.  He has a struggle with getting himself organized.  He is impulsive with his activities including spending.  He says that his parents suspected ADD but never got them evaluated when he was a kid because they did not want him to be stigmatized by the label.  He feels this is interfering with his college education.  He has a history of major  at [employer] state.  Depression screen [facility] 2/9 04/08/2021 04/08/2021 03/27/2021  Decreased Interest 0 0 0  Down, Depressed, Hopeless 1 0 0  PHQ - 2 Score 1 0 0  Altered sleeping 1 - 1  Tired, decreased energy 0 - 0  Change in appetite 2 - 1  Feeling bad or failure about yourself  2 - 1  Trouble concentrating 3 - 2  Moving slowly or fidgety/restless 1 - 0  Suicidal thoughts 0 - 0  PHQ-9 Score 10 - 5  Difficult doing work/chores Very difficult - Somewhat difficult   GAD 7 : Generalized Anxiety Score 04/08/2021 03/27/2021  Nervous, Anxious, on Edge 2 1  Control/stop worrying 2 1  Worry too much - different things 2 1  Trouble relaxing 2 3  Restless 1 0  Easily annoyed or irritable 0 1  Afraid - awful might happen 2 2  Total GAD 7 Score 11 9  Anxiety Difficulty Very difficult Not difficult at all     History Elgar has a past medical history of 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   He has no past surgical history on file.   His family history is not on file.He reports that he has never smoked. He has never used smokeless tobacco. He reports that he does not drink alcohol and does not use drugs.    ROS Review of Systems  Constitutional:  Negative for fever.  Respiratory:  Negative for shortness of breath.   Cardiovascular:  Negative for chest pain.   Musculoskeletal:  Negative for arthralgias.  Skin:  Negative for rash.  Psychiatric/Behavioral:  Positive for decreased concentration and sleep disturbance. Negative for behavioral problems. The patient is nervous/anxious. The patient is not hyperactive.    Objective:  BP 108/64   Pulse 83   Temp 97.8 F (36.6 C)   Ht 5\' 6"  (1.676 m)   Wt 149 lb 12.8 oz (67.9 kg)   SpO2 99%   BMI 24.18 kg/m   BP Readings from Last 3 Encounters:  04/08/21 108/64  03/27/21 120/78  01/29/21 122/77    Wt Readings from Last 3 Encounters:  04/08/21 149 lb 12.8 oz (67.9 kg)  03/27/21 147 lb 12.8 oz (67 kg)  01/29/21 152 lb (68.9 kg)     Physical Exam    Assessment & Plan:   Ferdie was seen today for trouble concentrating.  Diagnoses and all orders for this visit:  Attention deficit disorder (ADD) without hyperactivity  Other stimulant use, unspecified, uncomplicated -     ToxASSURE Select 13 (MW), Urine  Other orders -     lisdexamfetamine (VYVANSE) 20 MG capsule; Take 1 capsule (20 mg total) by mouth every morning.      I am having Reagen Bisig start on lisdexamfetamine.  Allergies as  of 04/08/2021   No Known Allergies      Medication List        Accurate as of April 08, 2021  6:02 PM. If you have any questions, ask your nurse or doctor.          lisdexamfetamine 20 MG capsule Commonly known as: Vyvanse Take 1 capsule (20 mg total) by mouth every morning. Started by: Mechele Claude, MD         Follow-up: Return in about 1 month (around 05/09/2021).  Mechele Claude, M.D.

## 2021-04-09 ENCOUNTER — Ambulatory Visit (HOSPITAL_COMMUNITY)
Admission: RE | Admit: 2021-04-09 | Discharge: 2021-04-09 | Disposition: A | Discharge status: Home / Self Care | Payer: Medicaid Other | Source: Ambulatory Visit | Attending: Family Medicine | Admitting: Family Medicine

## 2021-04-09 DIAGNOSIS — N50811 Right testicular pain: Secondary | ICD-10-CM | POA: Diagnosis not present

## 2021-04-09 DIAGNOSIS — N433 Hydrocele, unspecified: Secondary | ICD-10-CM | POA: Diagnosis not present

## 2021-04-11 LAB — TOXASSURE SELECT 13 (MW), URINE

## 2021-04-14 ENCOUNTER — Telehealth: Payer: Self-pay | Admitting: Family Medicine

## 2021-04-14 NOTE — Telephone Encounter (Signed)
PATIENT AWARE

## 2021-04-14 NOTE — Telephone Encounter (Signed)
Okay to just stop since he is on a starter dose. He cannot get a replacement without an office visit since the med is controlled.

## 2021-04-15 ENCOUNTER — Other Ambulatory Visit: Payer: Self-pay | Admitting: *Deleted

## 2021-05-08 ENCOUNTER — Ambulatory Visit: Payer: Medicaid Other | Admitting: Family Medicine

## 2021-08-08 IMAGING — US US THYROID
1 series · 14 of 25 positions shown · non-contrast
Comparison: None.

CLINICAL DATA: Palpable abnormality. 18-year-old male with palpable
thyromegaly.

EXAM:
THYROID ULTRASOUND
TECHNIQUE: Ultrasound examination of the thyroid gland and adjacent soft
tissues was performed.

[Series 1: us thyroid · 0.06mm/px · 14 of 49 slices shown]
[im 1/49]
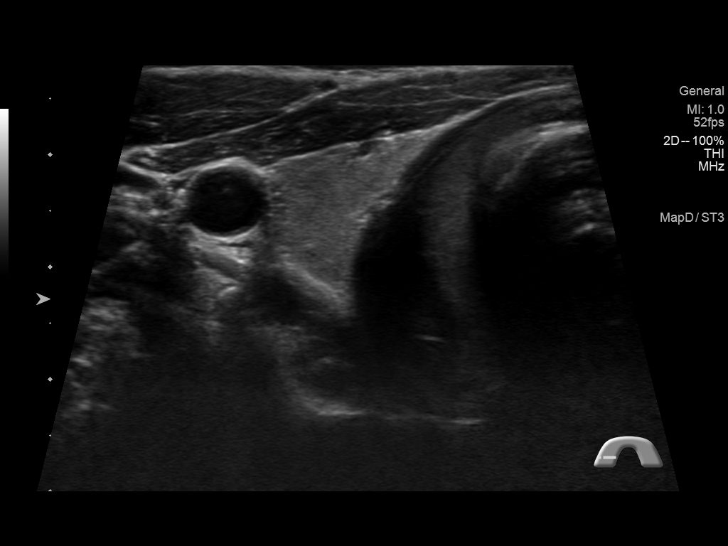
[im 5/49]
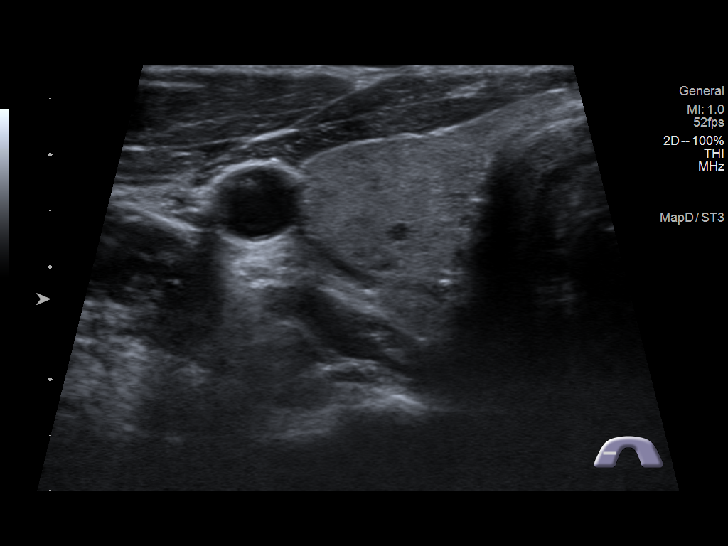
[im 9/49]
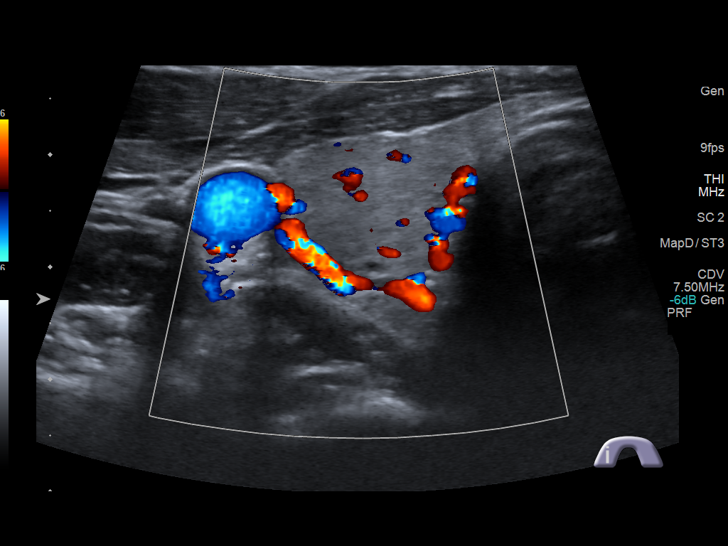
[im 13/49]
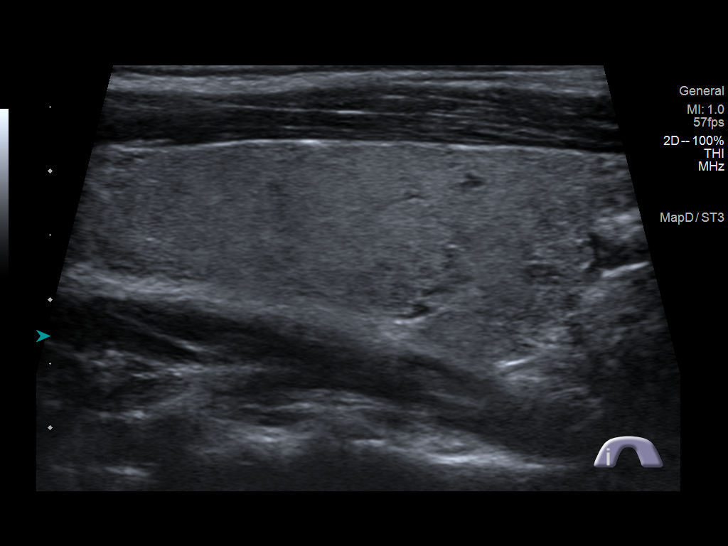
[im 17/49]
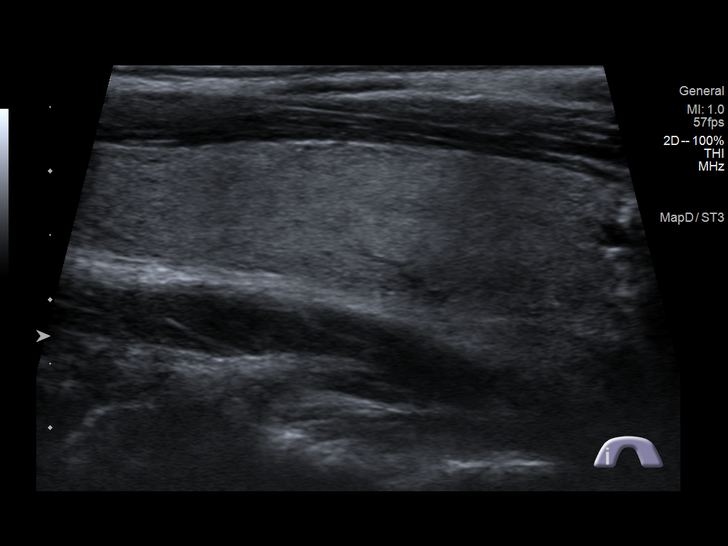
[im 19/49]
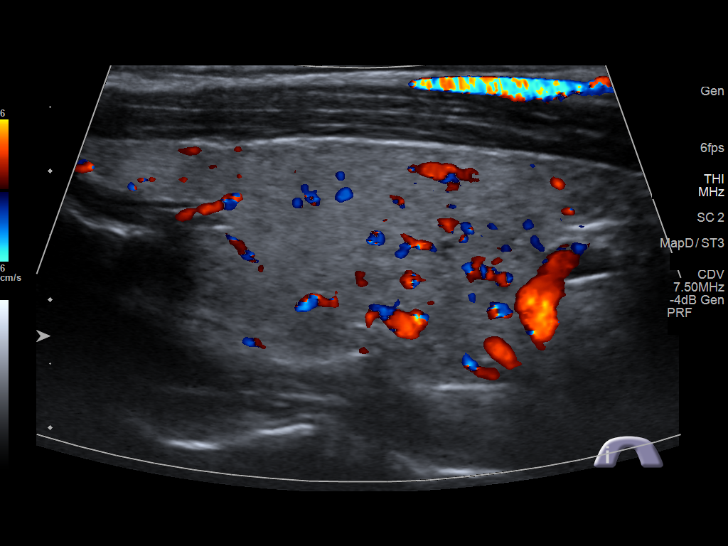
[im 23/49]
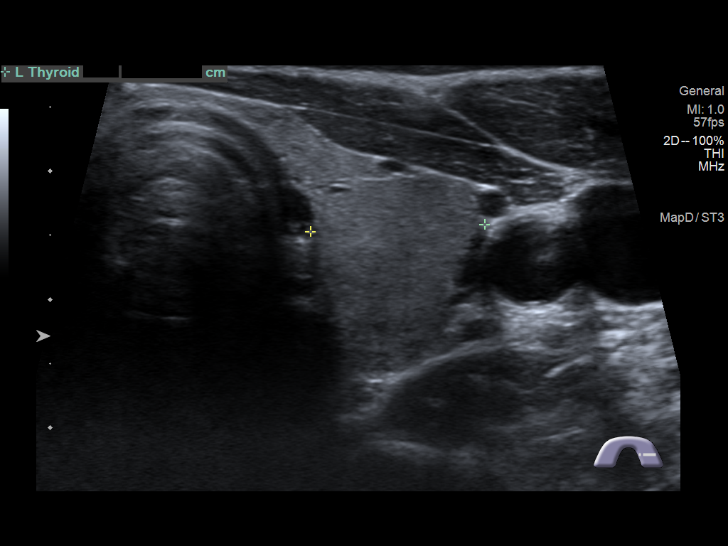
[im 27/49]
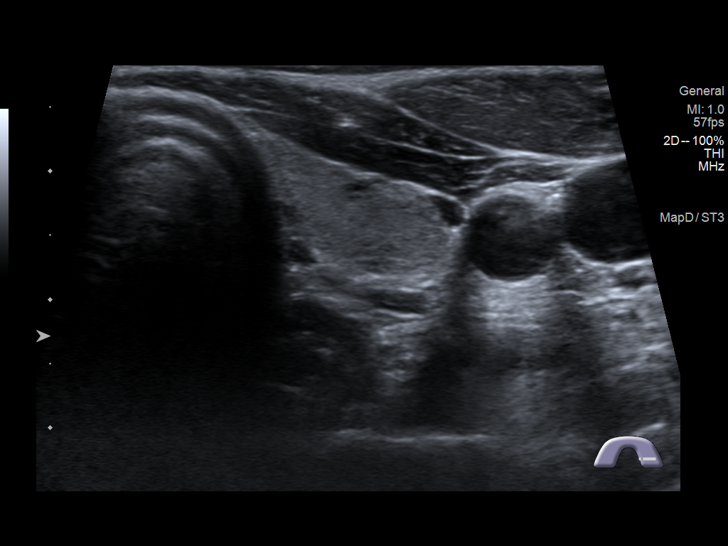
[im 31/49]
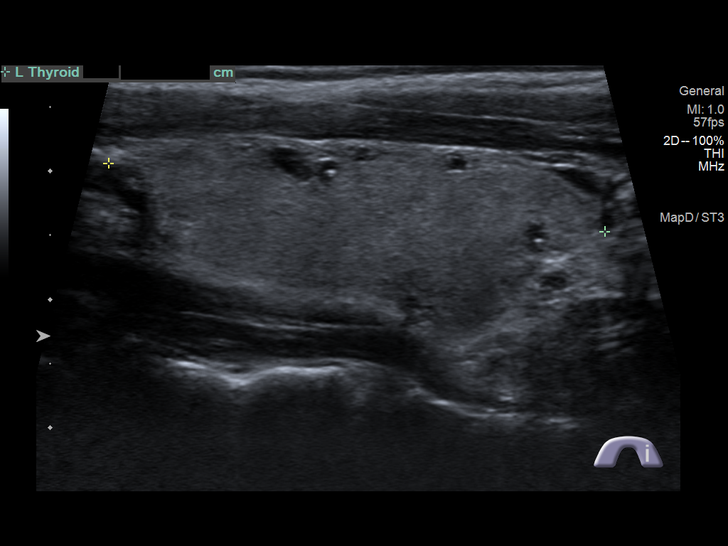
[im 33/49]
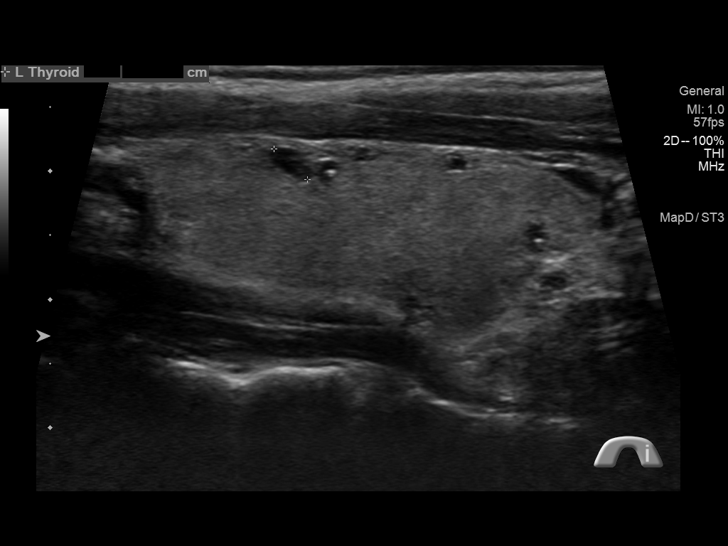
[im 37/49]
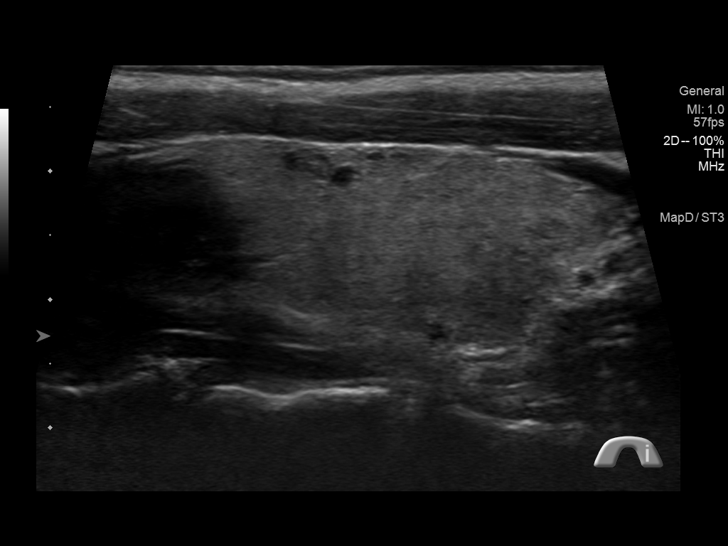
[im 41/49]
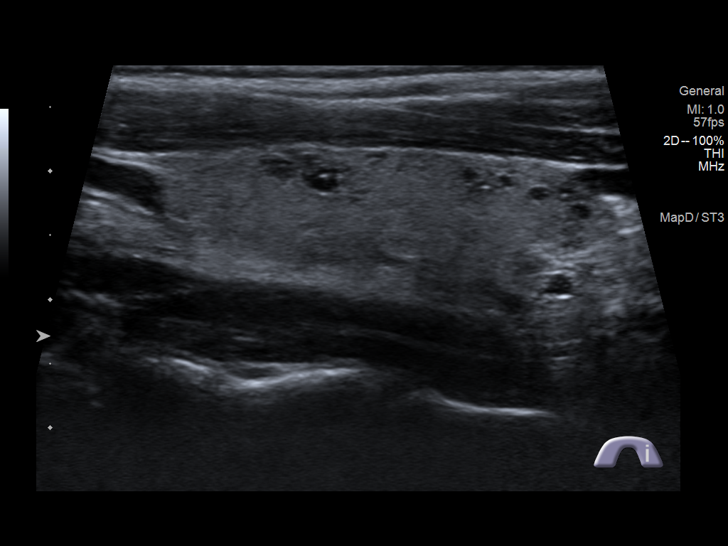
[im 45/49]
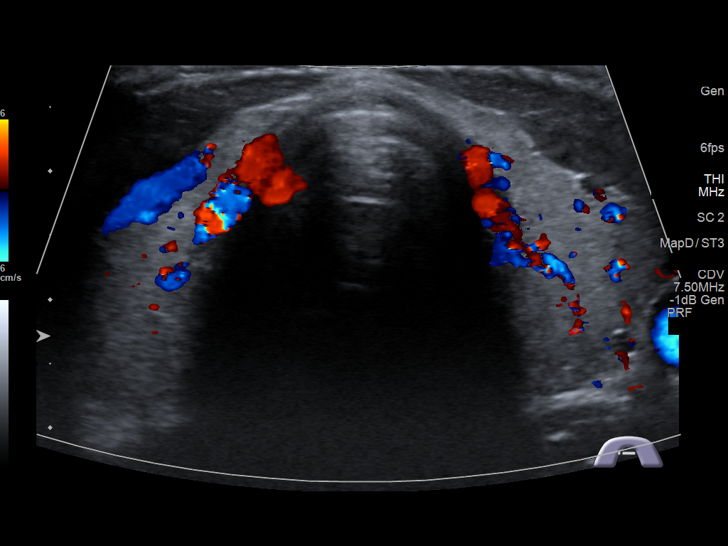
[im 49/49]
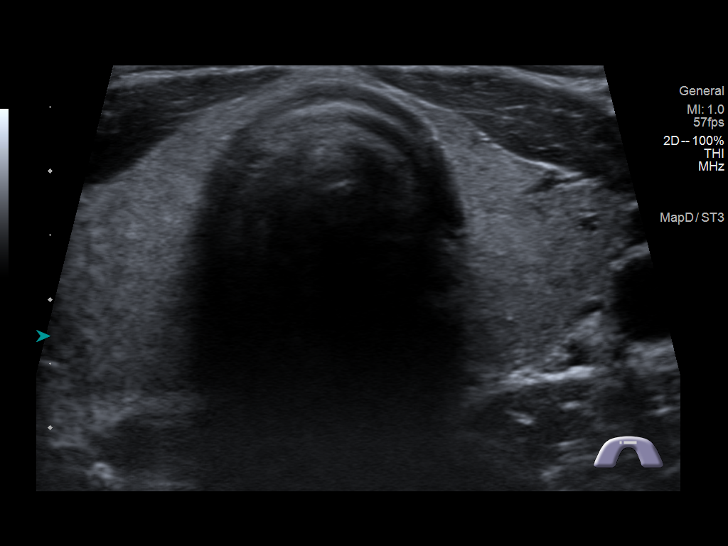

[14 of 25 positions shown; findings below may reference images not displayed]

FINDINGS: Parenchymal Echotexture: Mildly heterogenous

Isthmus: 0.2 cm

Right lobe: 4.7 x 1.7 x 1.9 cm

Left lobe: 3.9 x 1.3 x 1.8 cm

_________________________________________________________

Estimated total number of nodules >/= 1 cm: 0

Number of spongiform nodules >/=  2 cm not described below (TR1): 0

Number of mixed cystic and solid nodules >/= 1.5 cm not described
below (TR2): 0

_________________________________________________________

No discrete nodules of consequence are seen within the thyroid
gland.
IMPRESSION: Mildly heterogeneous but otherwise unremarkable thyroid gland.

## 2022-09-02 DIAGNOSIS — R070 Pain in throat: Secondary | ICD-10-CM | POA: Diagnosis not present

## 2022-09-02 DIAGNOSIS — R519 Headache, unspecified: Secondary | ICD-10-CM | POA: Diagnosis not present

## 2022-09-02 DIAGNOSIS — J039 Acute tonsillitis, unspecified: Secondary | ICD-10-CM | POA: Diagnosis not present

## 2022-09-02 DIAGNOSIS — R6883 Chills (without fever): Secondary | ICD-10-CM | POA: Diagnosis not present

## 2023-01-28 ENCOUNTER — Ambulatory Visit: Payer: Medicaid Other | Admitting: Nurse Practitioner

## 2023-01-28 ENCOUNTER — Encounter: Payer: Self-pay | Admitting: Nurse Practitioner

## 2023-01-28 VITALS — BP 118/72 | HR 77 | Temp 97.3°F | Ht 66.0 in | Wt 160.0 lb

## 2023-01-28 DIAGNOSIS — Z23 Encounter for immunization: Secondary | ICD-10-CM

## 2023-01-28 DIAGNOSIS — Z113 Encounter for screening for infections with a predominantly sexual mode of transmission: Secondary | ICD-10-CM | POA: Diagnosis not present

## 2023-01-28 DIAGNOSIS — H6121 Impacted cerumen, right ear: Secondary | ICD-10-CM | POA: Diagnosis not present

## 2023-01-28 DIAGNOSIS — J029 Acute pharyngitis, unspecified: Secondary | ICD-10-CM | POA: Diagnosis not present

## 2023-01-28 NOTE — Progress Notes (Signed)
Acute Office Visit  Subjective:     Patient ID: Kirk Holloway, male    DOB: 15-Nov-2000, 22 y.o.   MRN: [id number]  Chief Complaint  Patient presents with   screening for std    Screening for stds    HPI Kirk Holloway 22 year old male here today for an acute visit for the STD screening "I don't have any symptoms, I want to be sure that I am not a danger to the community" his partner was diagnosed with chlamydia and gonorrhea and treated he wants to make sure that he does not have any STDs. Denies penal discharge, frequency or urgency, dysuria, rash or open sores. He is currently not taking any medication denies any past medical diagnoses. Denies the use of tobacco, illicit drugs, drinks EtOH socially Never had HPV vaccine.  The 3 dose series today  ROS Negative unless indicated in HPI    Objective:    BP 118/72   Pulse 77   Temp (!) 97.3 F (36.3 C) (Temporal)   Ht 5\' 6"  (1.676 m)   Wt 160 lb (72.6 kg)   SpO2 100%   BMI 25.82 kg/m  BP Readings from Last 3 Encounters:  01/28/23 118/72  04/08/21 108/64  03/27/21 120/78   Wt Readings from Last 3 Encounters:  01/28/23 160 lb (72.6 kg)  04/08/21 149 lb 12.8 oz (67.9 kg)  03/27/21 147 lb 12.8 oz (67 kg)      Physical Exam Vitals and nursing note reviewed. Exam conducted with a chaperone present Lorelle Formosa CMA).  Constitutional:      General: He is not in acute distress.    Appearance: Normal appearance. He is normal weight. He is not ill-appearing.  HENT:     Head: Normocephalic and atraumatic.  Cardiovascular:     Rate and Rhythm: Normal rate.     Pulses: Normal pulses.  Pulmonary:     Effort: Pulmonary effort is normal.     Breath sounds: Normal breath sounds.  Genitourinary:    Pubic Area: No rash.      Penis: Normal and circumcised.      Testes: Normal. Cremasteric reflex is present.     Tanner stage (genital): 5.  Skin:    General: Skin is warm and dry.     Findings: No rash.  Neurological:     General: No  focal deficit present.     Mental Status: He is alert and oriented to person, place, and time. Mental status is at baseline.  Psychiatric:        Mood and Affect: Mood normal.        Behavior: Behavior normal.        Thought Content: Thought content normal.        Judgment: Judgment normal.     No results found for any visits on 01/28/23.      Assessment & Plan:  Screening for STDs (sexually transmitted diseases) -     RPR -     HepB+HepC+HIV Panel -     HSV 1 and 2 Ab, IgG -     Ct Ng M genitalium NAA, Urine -     HIV Antibody (routine testing w rflx)  Need for HPV vaccine -     HPV 9-valent vaccine,Recombinat  Sore throat  Cerumen debris on tympanic membrane of right ear  Assessment/plan STD screening Syphilis, hep C, hep B HIV, herpes 1 and 2, gonorrhea and chlamydia and HIV HPV vaccine administered first dose today 2nd dose HPV  03/01/2023 3rd dose  HPV 08/01/2023  Plan of care discussed with client, follow-up as scheduled or sooner if needed Return in about 1 month (around 03/01/2023) for for HVP vaccine.  Arrie Aran Santa Lighter, NP

## 2023-01-29 LAB — HSV 1 AND 2 AB, IGG
HSV 1 Glycoprotein G Ab, IgG: <0.91 index (ref 0.00–0.90)
HSV 2 IgG, Type Spec: <0.91 index (ref 0.00–0.90)

## 2023-01-29 LAB — HEPB+HEPC+HIV PANEL
HIV Screen 4th Generation wRfx: NONREACTIVE
Hep B C IgM: NEGATIVE
Hep B Core Total Ab: NEGATIVE
Hep B E Ab: NONREACTIVE
Hep B E Ag: NEGATIVE
Hep B Surface Ab, Qual: NONREACTIVE
Hep C Virus Ab: NONREACTIVE
Hepatitis B Surface Ag: NEGATIVE

## 2023-01-29 LAB — RPR: RPR Ser Ql: NONREACTIVE

## 2023-01-30 LAB — CT NG M GENITALIUM NAA, URINE
Chlamydia trachomatis, NAA: NEGATIVE
Mycoplasma genitalium NAA: NEGATIVE
Neisseria gonorrhoeae, NAA: NEGATIVE

## 2023-02-01 NOTE — Progress Notes (Signed)
HIV, Herpes, Chlamydia, gonorrhea, Syphilis, Hep-C, Hep-B are negative  Used condoms to prevent STDs

## 2023-03-08 ENCOUNTER — Ambulatory Visit: Payer: Medicaid Other | Admitting: Family Medicine

## 2023-03-08 ENCOUNTER — Ambulatory Visit (INDEPENDENT_AMBULATORY_CARE_PROVIDER_SITE_OTHER): Payer: Medicaid Other

## 2023-03-08 DIAGNOSIS — Z23 Encounter for immunization: Secondary | ICD-10-CM | POA: Diagnosis not present

## 2023-03-08 NOTE — Progress Notes (Signed)
Patient came in for 2nd hpv - given left deltoid patient tolerated well

## 2023-08-30 ENCOUNTER — Ambulatory Visit: Payer: Medicaid Other

## 2024-02-14 ENCOUNTER — Other Ambulatory Visit: Payer: Self-pay

## 2024-02-14 ENCOUNTER — Telehealth: Payer: Self-pay | Admitting: Family Medicine

## 2024-02-14 DIAGNOSIS — Z0000 Encounter for general adult medical examination without abnormal findings: Secondary | ICD-10-CM

## 2024-02-14 DIAGNOSIS — Z111 Encounter for screening for respiratory tuberculosis: Secondary | ICD-10-CM

## 2024-02-14 NOTE — Telephone Encounter (Signed)
Orders placed.  -LS

## 2024-02-14 NOTE — Telephone Encounter (Signed)
 Can not see note please advise on what patient needed.

## 2024-02-14 NOTE — Telephone Encounter (Signed)
  Appointment (Patient has appt 6-17 for labs for upcoming CPE with [facility]. Needs orders put in.

## 2024-02-15 ENCOUNTER — Other Ambulatory Visit

## 2024-02-15 DIAGNOSIS — Z111 Encounter for screening for respiratory tuberculosis: Secondary | ICD-10-CM | POA: Diagnosis not present

## 2024-02-15 DIAGNOSIS — Z0000 Encounter for general adult medical examination without abnormal findings: Secondary | ICD-10-CM | POA: Diagnosis not present

## 2024-02-16 LAB — CBC WITH DIFFERENTIAL/PLATELET
Basophils Absolute: 0.1 10*3/uL (ref 0.0–0.2)
Eos: 2 %
Hematocrit: 48.7 % (ref 37.5–51.0)
Immature Granulocytes: 0 %
Lymphocytes Absolute: 2 10*3/uL (ref 0.7–3.1)
Lymphs: 36 %
Monocytes Absolute: 0.5 10*3/uL (ref 0.1–0.9)
Neutrophils Absolute: 2.9 10*3/uL (ref 1.4–7.0)
RBC: 5.31 x10E6/uL (ref 4.14–5.80)
RDW: 11.7 % (ref 11.6–15.4)

## 2024-02-16 LAB — LIPID PANEL
Chol/HDL Ratio: 3.2 ratio (ref 0.0–5.0)
LDL Chol Calc (NIH): 98 mg/dL (ref 0–99)

## 2024-02-16 LAB — CMP14+EGFR
Albumin: 5 g/dL (ref 4.3–5.2)
Bilirubin Total: 1.2 mg/dL (ref 0.0–1.2)
CO2: 22 mmol/L (ref 20–29)
Globulin, Total: 2.3 g/dL (ref 1.5–4.5)
Glucose: 91 mg/dL (ref 70–99)
Total Protein: 7.3 g/dL (ref 6.0–8.5)
eGFR: 89 mL/min/{1.73_m2} (ref >59)

## 2024-02-20 LAB — CBC WITH DIFFERENTIAL/PLATELET
Basos: 1 %
EOS (ABSOLUTE): 0.1 10*3/uL (ref 0.0–0.4)
Hemoglobin: 16.3 g/dL (ref 13.0–17.7)
Immature Grans (Abs): 0 10*3/uL (ref 0.0–0.1)
MCH: 30.7 pg (ref 26.6–33.0)
MCHC: 33.5 g/dL (ref 31.5–35.7)
MCV: 92 fL (ref 79–97)
Monocytes: 9 %
Neutrophils: 52 %
Platelets: 272 10*3/uL (ref 150–450)
WBC: 5.5 10*3/uL (ref 3.4–10.8)

## 2024-02-20 LAB — CMP14+EGFR
ALT: 13 IU/L (ref 0–44)
AST: 18 IU/L (ref 0–40)
Alkaline Phosphatase: 85 IU/L (ref 44–121)
BUN/Creatinine Ratio: 12 (ref 9–20)
BUN: 14 mg/dL (ref 6–20)
Calcium: 9.9 mg/dL (ref 8.7–10.2)
Chloride: 103 mmol/L (ref 96–106)
Creatinine, Ser: 1.17 mg/dL (ref 0.76–1.27)
Potassium: 5.2 mmol/L (ref 3.5–5.2)
Sodium: 142 mmol/L (ref 134–144)

## 2024-02-20 LAB — QUANTIFERON-TB GOLD PLUS
QuantiFERON Nil Value: 0.02 [IU]/mL
QuantiFERON TB1 Ag Value: 0.04 [IU]/mL
QuantiFERON TB2 Ag Value: 0.05 [IU]/mL

## 2024-02-20 LAB — LIPID PANEL
Cholesterol, Total: 167 mg/dL (ref 100–199)
HDL: 53 mg/dL (ref >39)
Triglycerides: 86 mg/dL (ref 0–149)
VLDL Cholesterol Cal: 16 mg/dL (ref 5–40)

## 2024-02-22 ENCOUNTER — Ambulatory Visit: Payer: Self-pay | Admitting: Family Medicine

## 2024-02-28 ENCOUNTER — Ambulatory Visit: Admitting: Family Medicine

## 2024-02-28 VITALS — BP 98/64 | HR 81 | Temp 98.1°F | Ht 66.0 in | Wt 160.0 lb

## 2024-02-28 DIAGNOSIS — Z23 Encounter for immunization: Secondary | ICD-10-CM | POA: Diagnosis not present

## 2024-02-28 DIAGNOSIS — Z0000 Encounter for general adult medical examination without abnormal findings: Secondary | ICD-10-CM | POA: Diagnosis not present

## 2024-02-28 DIAGNOSIS — Z7251 High risk heterosexual behavior: Secondary | ICD-10-CM

## 2024-02-28 NOTE — Progress Notes (Signed)
 Subjective:  Patient ID: Kirk Holloway, male    DOB: 2000/10/24  Age: 23 y.o. MRN: [id number]  CC: Annual Exam (Form completion)   HPI [name] presents for CPE, for employment with school system. Sexually promiscuous. Having painful ejaculations at times. No penile DC.      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   He has no past surgical history on file.   His family history is not on file.He reports that he has never smoked. He has never used smokeless tobacco. He reports that he does not drink alcohol and does not use drugs.    ROS Review of Systems  Constitutional:  Negative for activity change, fatigue and unexpected weight change.  HENT:  Negative for congestion, ear pain, hearing loss, postnasal drip and trouble swallowing.   Eyes:  Negative for pain and visual disturbance.  Respiratory:  Negative for cough, chest tightness and shortness of breath.   Cardiovascular:  Negative for chest pain, palpitations and leg swelling.  Gastrointestinal:  Negative for abdominal distention, abdominal pain, blood in stool, constipation, diarrhea, nausea and vomiting.  Endocrine: Negative for cold intolerance, heat intolerance and polydipsia.  Genitourinary:  Positive for penile pain (Pt. reports intermittent pain with ejaculation. None for a few weeks.). Negative for difficulty urinating, dysuria, flank pain, frequency and urgency.   Musculoskeletal:  Negative for arthralgias and joint swelling.  Skin:  Negative for color change, rash and wound.  Neurological:  Negative for dizziness, syncope, speech difficulty, weakness, light-headedness, numbness and headaches.  Hematological:  Does not bruise/bleed easily.  Psychiatric/Behavioral:  Negative for confusion, decreased concentration, dysphoric mood and sleep disturbance. The patient is not nervous/anxious.     Objective:  BP 98/64   Pulse 81   Temp 98.1 F (36.7 C)   Ht 5' 6 (1.676 m)   Wt 160 lb (72.6 kg)   SpO2 96%   BMI 25.82 kg/m   BP Readings from Last 3 Encounters:  02/28/24 98/64  01/28/23 118/72  04/08/21 108/64    Wt Readings from Last 3 Encounters:  02/28/24 160 lb (72.6 kg)  01/28/23 160 lb (72.6 kg)  04/08/21 149 lb 12.8 oz (67.9 kg)     Physical Exam Constitutional:      Appearance: He is well-developed.  HENT:     Head: Normocephalic and atraumatic.   Eyes:     Pupils: Pupils are equal, round, and reactive to light.   Neck:     Thyroid: No thyromegaly.     Trachea: No tracheal deviation.   Cardiovascular:     Rate and Rhythm: Normal rate and regular rhythm.     Heart sounds: Normal heart sounds. No murmur heard.    No friction rub. No gallop.  Pulmonary:     Breath sounds: Normal breath sounds. No wheezing  or rales.  Abdominal:     General: Bowel sounds are normal. There is no distension.     Palpations: Abdomen is soft. There is no mass.     Tenderness: There is no abdominal tenderness.     Hernia: There is no hernia in the left inguinal area.  Genitourinary:    Penis: Normal.      Testes: Normal.   Musculoskeletal:        General: Normal range of motion.     Cervical back: Normal range of motion.  Lymphadenopathy:     Cervical: No cervical adenopathy.   Skin:    General: Skin is warm and dry.   Neurological:     Mental Status: He is alert and oriented to person, place, and time.      Assessment & Plan:   Annual physical exam  High risk heterosexual behavior -     HIV Antibody (routine testing w rflx) -     RPR -     Ct Ng M genitalium NAA, Urine  Immunization due -     Heplisav-B (HepB-CPG) Vaccine     Follow-up: No follow-ups on file.  Butler Der, M.D.

## 2024-02-29 LAB — RPR: RPR Ser Ql: NONREACTIVE

## 2024-02-29 LAB — HIV ANTIBODY (ROUTINE TESTING W REFLEX): HIV Screen 4th Generation wRfx: NONREACTIVE

## 2024-03-01 LAB — CT NG M GENITALIUM NAA, URINE
Chlamydia trachomatis, NAA: NEGATIVE
Mycoplasma genitalium NAA: POSITIVE — AB
Neisseria gonorrhoeae, NAA: NEGATIVE

## 2024-03-09 ENCOUNTER — Ambulatory Visit: Payer: Self-pay | Admitting: Family Medicine

## 2024-03-09 ENCOUNTER — Telehealth: Payer: Self-pay

## 2024-03-09 ENCOUNTER — Other Ambulatory Visit: Payer: Self-pay | Admitting: Family Medicine

## 2024-03-09 DIAGNOSIS — A493 Mycoplasma infection, unspecified site: Secondary | ICD-10-CM

## 2024-03-09 MED ORDER — MOXIFLOXACIN HCL 400 MG PO TABS: 400.0000 mg | ORAL_TABLET | Freq: Every day | ORAL | 0 refills | Status: DC

## 2024-03-09 NOTE — Telephone Encounter (Signed)
 I spoke to Kirk Holloway this morning.

## 2024-03-09 NOTE — Progress Notes (Signed)
 Pt. Was notified of results and plan of action. To come in for resistance testing. Moxifloxacin  sent to pharmacy. Warned to abstain until tx complete. WS

## 2024-03-09 NOTE — Telephone Encounter (Signed)
 Copied from CRM [id number]. Topic: Clinical - Lab/Test Results >> Mar 09, 2024  9:24 AM Emylou G wrote: Reason for CRM: Pls call patient.. concerned if he needs meds.. what were his lab results?

## 2024-03-09 NOTE — Telephone Encounter (Signed)
 Noted, thank you. LS

## 2024-03-13 ENCOUNTER — Other Ambulatory Visit

## 2024-03-14 ENCOUNTER — Other Ambulatory Visit

## 2024-03-14 ENCOUNTER — Other Ambulatory Visit: Payer: Self-pay

## 2024-03-14 DIAGNOSIS — A493 Mycoplasma infection, unspecified site: Secondary | ICD-10-CM

## 2024-03-16 LAB — M GEN MACROLIDE RESISTANCE: M gen Macrolide Resistance: DETECTED — AB

## 2024-03-20 ENCOUNTER — Ambulatory Visit: Payer: Self-pay | Admitting: Family Medicine

## 2024-03-29 ENCOUNTER — Ambulatory Visit

## 2024-06-06 ENCOUNTER — Telehealth: Payer: Self-pay | Admitting: Family Medicine

## 2024-06-06 NOTE — Telephone Encounter (Signed)
 Called and spoke with patient and made him aware. Appt has been scheduled for next Wednesday 10/15 and patient is aware.

## 2024-06-06 NOTE — Telephone Encounter (Signed)
 Copied from CRM #[id number]. Topic: Clinical - Lab/Test Results >> Jun 06, 2024 10:02 AM Corin V wrote: Reason for CRM: Patient stated he had an STD a few months ago and he would like to get repeat labs to ensure it is gone. Please call back [phone]

## 2024-06-06 NOTE — Telephone Encounter (Signed)
 Looks like patient saw PCP back in July for this. Dr Zollie, does patient need another appt or can we order lab to be rechecked?

## 2024-06-06 NOTE — Telephone Encounter (Signed)
Pt. Needs to be seen for this. Thanks, WS 

## 2024-06-14 ENCOUNTER — Ambulatory Visit: Admitting: Family Medicine

## 2024-06-14 VITALS — BP 104/62 | HR 82 | Temp 98.5°F | Ht 66.0 in | Wt 154.8 lb

## 2024-06-14 DIAGNOSIS — Z113 Encounter for screening for infections with a predominantly sexual mode of transmission: Secondary | ICD-10-CM | POA: Diagnosis not present

## 2024-06-14 NOTE — Progress Notes (Signed)
 Chief Complaint  Patient presents with   Exposure to STD    Wants retest to ensure that he is clear     HPI  Patient presents today for Discussed the use of AI scribe software for clinical note transcription with the patient, who gave verbal consent to proceed.  History of Present Illness Kirk Holloway is a 23 year old male who presents for retesting of Mycoplasma genitalium infection.  He was previously diagnosed with Mycoplasma genitalium infection, while tests for chlamydia and gonorrhea were negative. He no longer experiences burning during urination or testicular pain, but notes a persistent 'green tint' in his urine, which was previously more pronounced.  Since his last test in June, he has had one new sexual partner. He is actively trying to reduce the number of new sexual partners and is seeking more meaningful relationships. He is using dating apps less frequently.  He has insurance coverage and is concerned about the cost of testing.     PMH: Smoking status noted Review of Systems  Constitutional:  Negative for fever.  Respiratory:  Negative for shortness of breath.   Cardiovascular:  Negative for chest pain.  Musculoskeletal:  Negative for arthralgias.  Skin:  Negative for rash.    Objective: BP 104/62   Pulse 82   Temp 98.5 F (36.9 C)   Ht 5' 6 (1.676 m)   Wt 154 lb 12.8 oz (70.2 kg)   SpO2 96%   BMI 24.99 kg/m  Gen: NAD, alert, cooperative with exam HEENT: NCAT, EOMI, PERRL CV: RRR, good S1/S2, no murmur Resp: CTABL, no wheezes, non-labored Ext: No edema, warm Neuro: Alert and oriented, No gross deficits  Screening for STDs (sexually transmitted diseases) -     Ct Ng M genitalium NAA, Urine    Assessment and Plan Assessment & Plan Mycoplasma genitalium retesting   Omega seeks retesting for Mycoplasma genitalium following a previous positive result. He reports no dysuria or testicular pain but observes a green tint in his urine, which is less  pronounced than before. He has had one new sexual partner since his last test in June. Retesting is warranted due to the new partner and the urine discoloration. Order a Mycoplasma genitalium test via urine specimen. Advise him to inform his recent partner if the test is positive. Instruct him to proceed to the lab for urine specimen collection.      Butler Der, MD

## 2024-06-15 LAB — CT NG M GENITALIUM NAA, URINE
Chlamydia trachomatis, NAA: NEGATIVE
Mycoplasma genitalium NAA: NEGATIVE
Neisseria gonorrhoeae, NAA: NEGATIVE
